# Patient Record
Sex: Female | Born: 1965 | Hispanic: Yes | Marital: Married
Health system: Southern US, Community
[De-identification: ages and names within clinical notes are randomized; demographics above are authoritative.]

## PROBLEM LIST (undated history)

## (undated) HISTORY — PX: BREAST BIOPSY: SHX20

---

## 2014-12-13 ENCOUNTER — Ambulatory Visit (INDEPENDENT_AMBULATORY_CARE_PROVIDER_SITE_OTHER): Payer: Self-pay | Admitting: Family Medicine

## 2014-12-13 VITALS — BP 112/70 | HR 68 | Temp 97.5°F | Resp 18 | Ht 60.0 in | Wt 142.5 lb

## 2014-12-13 DIAGNOSIS — N63 Unspecified lump in unspecified breast: Secondary | ICD-10-CM

## 2014-12-13 DIAGNOSIS — Z1239 Encounter for other screening for malignant neoplasm of breast: Secondary | ICD-10-CM

## 2014-12-13 NOTE — Patient Instructions (Signed)
I think that the bump in your breast is due to your recent injury and should go away.  However if it is not gone within one month please come back to see me.   Assuming the bump does go away, you should have a screening mammogram anyway.    Please use the scholarship fund application to see if you can get a free or low cost screening exam If your bump does not away I will set you up for a special mammogram to look a the area of concern

## 2014-12-13 NOTE — Progress Notes (Signed)
[facility] [address] [place] [address] [phone]  Date:  12/13/2014   Name:  Tanya Martin   DOB:  February 10, 1966   MRN:  [id number]  PCP:  No PCP Per Patient    Chief Complaint: Chest Pain and Breast Mass   History of Present Illness:  Tanya Martin is a 49 y.o. very pleasant female patient who presents with the following:  Here today as a new patient with complaint of pain in her left breast.  She hit herself with a box this past Friday, and the pain started on Monday.  Today is Sunday. She feels a little bump in the area- she treated it with vinegar and it got better.   The pain has decreased some.   She is generally in good health.  No fever, cough or other chest pain This bump in her left breast was not present prior to her recent injury  There are no active problems to display for this patient.   No past medical history on file.  No past surgical history on file.  History  Substance Use Topics  . Smoking status: Never Smoker   . Smokeless tobacco: Not on file  . Alcohol Use: No    Family History  Problem Relation Age of Onset  . Diabetes Mother     No Known Allergies  Medication list has been reviewed and updated.  No current outpatient prescriptions on file prior to visit.   No current facility-administered medications on file prior to visit.    Review of Systems:  As per HPI- otherwise negative.   Physical Examination: Filed Vitals:   12/13/14 1500  BP: 112/70  Pulse: 68  Temp: 97.5 F (36.4 C)  Resp: 18   Filed Vitals:   12/13/14 1500  Height: 5' (1.524 m)  Weight: 142 lb 8 oz (64.638 kg)   Body mass index is 27.83 kg/(m^2). Ideal Body Weight: Weight in (lb) to have BMI = 25: 127.7  GEN: WDWN, NAD, Non-toxic, A & O x 3 HEENT: Atraumatic, Normocephalic. Neck supple. No masses, No LAD. Ears and Nose: No external deformity. CV: RRR, No M/G/R. No JVD. No thrill. No extra heart sounds. PULM: CTA B, no wheezes,  crackles, rhonchi. No retractions. No resp. distress. No accessory muscle use. ABD: S, NT, ND. No rebound. No HSM. EXTR: No c/c/e NEURO Normal gait.  PSYCH: Normally interactive. Conversant. Not depressed or anxious appearing.  Calm demeanor.  Breasts: the right breast is normal.  The left breast shows a tender nodule at approx 2:00.  It is approx 2cm vertically and 3cm horizontally.  No break in the skin, no redness or fluctuance   Assessment and Plan: Breast nodule  Screening for breast cancer  Suspect nodule in the left breast is due to recent trauma.  Counseled pt that if this resolves she will need a screening mammogram- printed and helped them begin a mammo scholarship application.  However if nodule does not go away will need a diagnostic mammo instead- went over this with pt and her daughter who accompanied her to visit today  Signed Abbe Amsterdam, MD

## 2017-05-17 ENCOUNTER — Ambulatory Visit: Payer: Self-pay | Attending: Oncology

## 2017-05-17 ENCOUNTER — Other Ambulatory Visit: Payer: Self-pay

## 2017-05-17 ENCOUNTER — Ambulatory Visit
Admission: RE | Admit: 2017-05-17 | Discharge: 2017-05-17 | Disposition: A | Discharge status: Home / Self Care | Payer: Self-pay | Source: Ambulatory Visit | Attending: Oncology | Admitting: Oncology

## 2017-05-17 VITALS — BP 108/73 | HR 61 | Temp 95.8°F | Resp 18 | Ht 60.0 in | Wt 149.0 lb

## 2017-05-17 DIAGNOSIS — Z0000 Encounter for general adult medical examination without abnormal findings: Secondary | ICD-10-CM

## 2017-05-17 NOTE — Progress Notes (Signed)
Subjective:     Patient ID: Tanya Martin, female   DOB: 26-Jun-1965, 51 y.o.   MRN: [id number]  HPI   Review of Systems     Objective:   Physical Exam  Pulmonary/Chest: Right breast exhibits no inverted nipple, no mass, no nipple discharge, no skin change and no tenderness. Left breast exhibits no inverted nipple, no mass, no nipple discharge, no skin change and no tenderness. Breasts are symmetrical.       Assessment:     51 year old hispanicpatient presents for [facility] clinic visit.  Tanya Martin interpreted exam.  Patient states she had a negative pap two weeks ago. Tanya Martin is requesting results to be scanned.  Patient screened, and meets [facility] eligibility.  Patient does not have insurance, Medicare or Medicaid.  Handout given on Affordable Care Act.  Instructed patient on breast self-exam using teach back method.  CBE unremarkable.  No mass or lump palpated.    Plan:     Sent for bilateral screening mammogram.

## 2017-05-19 ENCOUNTER — Other Ambulatory Visit: Payer: Self-pay | Admitting: *Deleted

## 2017-05-19 DIAGNOSIS — N6489 Other specified disorders of breast: Secondary | ICD-10-CM

## 2018-04-25 ENCOUNTER — Ambulatory Visit
Admission: RE | Admit: 2018-04-25 | Discharge: 2018-04-25 | Disposition: A | Discharge status: Home / Self Care | Payer: Self-pay | Source: Ambulatory Visit | Attending: Oncology | Admitting: Oncology

## 2018-04-25 ENCOUNTER — Encounter (INDEPENDENT_AMBULATORY_CARE_PROVIDER_SITE_OTHER): Payer: Self-pay

## 2018-04-25 ENCOUNTER — Other Ambulatory Visit: Payer: Self-pay

## 2018-04-25 ENCOUNTER — Ambulatory Visit: Payer: Self-pay | Attending: Oncology | Admitting: *Deleted

## 2018-04-25 ENCOUNTER — Encounter: Payer: Self-pay | Admitting: *Deleted

## 2018-04-25 VITALS — BP 117/73 | HR 52 | Temp 97.7°F | Ht 60.0 in | Wt 142.0 lb

## 2018-04-25 DIAGNOSIS — N6459 Other signs and symptoms in breast: Secondary | ICD-10-CM | POA: Insufficient documentation

## 2018-04-25 NOTE — Progress Notes (Signed)
  Subjective:     Patient ID: Tanya Martin, female   DOB: 12/14/1965, 52 y.o.   MRN: [id number]  HPI   Review of Systems     Objective:   Physical Exam  Pulmonary/Chest:         Assessment:     52 year old Hispanic female presents to [facility] for annual screening.  Tanya Martin, the interpreter present during the interview and exam.  Patient did not return last year for her additional views and ultrasound of the right breast for distortion and asymmetry.  On clinical breast exam today there is an asymmetrical 3 cm thickening at 1-2:00 left breast.  Patient states 2 years ago a box hit her in the breast at that spot, and she had some swelling there. Taught self breast awareness.   Her 48 year old sister has just been diagnosed with breast cancer.  Patient states her sister had genetic testing and no mutations were found.  Last pap on 04/19/17 was negative without HPV co-testing.  Patient has been screened for eligibility.  She does not have any insurance, Medicare or Medicaid.  She also meets financial eligibility.  Hand-out given on the Affordable Care Act. Risk Assessment    Risk Scores      04/25/2018   Last edited by: Tanya Presto, RN   5-year risk: 1.3 %   Lifetime risk: 10 %            Plan:     *Bilateral diagnostic mammogram with ultrasound ordered.  Will follow-up per [facility] protocol.  Encouraged annual screening.**

## 2018-04-25 NOTE — Patient Instructions (Signed)
Gave patient hand-out, Women Staying Healthy, Active and Well from [facility], with education on breast health, pap smears, heart and colon health. 

## 2018-04-26 ENCOUNTER — Encounter: Payer: Self-pay | Admitting: *Deleted

## 2018-04-26 NOTE — Progress Notes (Signed)
Patient was unable to get her mammogram yesterday.  Letter sent for translation with appointment for her rescheduled mammogram.

## 2018-04-30 ENCOUNTER — Encounter: Payer: Self-pay | Admitting: *Deleted

## 2018-04-30 NOTE — Progress Notes (Signed)
Letter mailed to inform patient of her mammogram appointment.

## 2018-05-11 ENCOUNTER — Ambulatory Visit
Admission: RE | Admit: 2018-05-11 | Discharge: 2018-05-11 | Disposition: A | Discharge status: Home / Self Care | Payer: Self-pay | Source: Ambulatory Visit | Attending: Oncology | Admitting: Oncology

## 2018-05-11 ENCOUNTER — Ambulatory Visit: Admission: RE | Admit: 2018-05-11 | Payer: Self-pay | Source: Ambulatory Visit

## 2018-05-11 DIAGNOSIS — N6459 Other signs and symptoms in breast: Secondary | ICD-10-CM | POA: Insufficient documentation

## 2018-05-22 ENCOUNTER — Encounter: Payer: Self-pay | Admitting: *Deleted

## 2018-05-22 NOTE — Progress Notes (Signed)
Letter mailed from [facility] to inform patient of her normal mammogram results.  Patient is to follow-up with annual screening in one year.  HSIS to Christy. 

## 2018-05-25 ENCOUNTER — Encounter: Payer: Self-pay | Admitting: Family Medicine

## 2019-11-08 ENCOUNTER — Ambulatory Visit: Payer: HRSA Program | Attending: Internal Medicine

## 2019-11-08 DIAGNOSIS — Z20822 Contact with and (suspected) exposure to covid-19: Secondary | ICD-10-CM | POA: Insufficient documentation

## 2019-11-09 LAB — NOVEL CORONAVIRUS, NAA: SARS-CoV-2, NAA: NOT DETECTED

## 2019-11-09 LAB — SARS-COV-2, NAA 2 DAY TAT

## 2020-07-03 ENCOUNTER — Emergency Department: Payer: Worker's Compensation

## 2020-07-03 ENCOUNTER — Other Ambulatory Visit: Payer: Self-pay

## 2020-07-03 DIAGNOSIS — Y990 Civilian activity done for income or pay: Secondary | ICD-10-CM | POA: Diagnosis not present

## 2020-07-03 DIAGNOSIS — W010XXA Fall on same level from slipping, tripping and stumbling without subsequent striking against object, initial encounter: Secondary | ICD-10-CM | POA: Diagnosis not present

## 2020-07-03 DIAGNOSIS — S3992XA Unspecified injury of lower back, initial encounter: Secondary | ICD-10-CM | POA: Diagnosis present

## 2020-07-03 DIAGNOSIS — S300XXA Contusion of lower back and pelvis, initial encounter: Secondary | ICD-10-CM | POA: Diagnosis not present

## 2020-07-03 NOTE — ED Triage Notes (Signed)
Assist of spanish interpreter enrique # [id number], with stratus. Pt states she slipped and fell at work on Tuesday landing on buttocks. Pt states she has not bee seen and continues to have buttock/low bak pain. Pt states she works for [employer].

## 2020-07-03 NOTE — ED Notes (Signed)
Pt provided with supervisors name Mikle Bosworth for workers compensation question no answer left VM [phone]

## 2020-07-04 ENCOUNTER — Emergency Department
Admission: EM | Admit: 2020-07-04 | Discharge: 2020-07-04 | Disposition: A | Discharge status: Home / Self Care | Payer: Worker's Compensation | Attending: Emergency Medicine | Admitting: Emergency Medicine

## 2020-07-04 DIAGNOSIS — S300XXA Contusion of lower back and pelvis, initial encounter: Secondary | ICD-10-CM

## 2020-07-04 MED ORDER — LIDOCAINE 5 % EX PTCH
1.0000 | MEDICATED_PATCH | CUTANEOUS | Status: DC
  Administered 2020-07-04: 1 via TRANSDERMAL
  Filled 2020-07-04: qty 1

## 2020-07-04 MED ORDER — LIDOCAINE 5 % EX PTCH: 1.0000 | MEDICATED_PATCH | Freq: Two times a day (BID) | CUTANEOUS | 0 refills | Status: AC

## 2020-07-04 MED ORDER — TRAMADOL HCL 50 MG PO TABS: 50.0000 mg | ORAL_TABLET | Freq: Four times a day (QID) | ORAL | 0 refills | Status: AC | PRN

## 2020-07-04 MED ORDER — NAPROXEN 500 MG PO TABS: 500.0000 mg | ORAL_TABLET | Freq: Two times a day (BID) | ORAL | Status: AC

## 2020-07-04 MED ORDER — TRAMADOL HCL 50 MG PO TABS: 50.0000 mg | ORAL_TABLET | Freq: Four times a day (QID) | ORAL | 0 refills | Status: DC | PRN

## 2020-07-04 MED ORDER — LIDOCAINE 5 % EX PTCH: 1.0000 | MEDICATED_PATCH | Freq: Two times a day (BID) | CUTANEOUS | 0 refills | Status: DC

## 2020-07-04 NOTE — ED Provider Notes (Signed)
[facility] Emergency Department Provider Note   ____________________________________________   Event Date/Time   First MD Initiated Contact with Patient 07/04/20 [phone]     (approximate)  I have reviewed the triage vital signs and the nursing notes.   HISTORY  Chief Complaint Fall    HPI Tanya Martin is a 55 y.o. female patient presents with coccyx pain secondary to a fall.  Patient states incident occurred at work when she slipped on some wall on the floor at work.  Incident occurred 4 days ago.  Patient denies radicular component to her fall.  Patient denies bladder or bowel dysfunction.  Patient states only mild relief over-the-counter medications.  Rates the pain as a 10/10.  Described pain as "achy".         No past medical history on file.  There are no problems to display for this patient.   Past Surgical History:  Procedure Laterality Date  . BREAST BIOPSY Right    benign    Prior to Admission medications   Medication Sig Start Date End Date Taking? Authorizing Provider  naproxen (NAPROSYN) 500 MG tablet Take 1 tablet (500 mg total) by mouth 2 (two) times daily with a meal. 07/04/20  Yes Joni Reining, PA-C  lidocaine (LIDODERM) 5 % Place 1 patch onto the skin every 12 (twelve) hours. Remove & Discard patch within 12 hours or as directed by MD 07/04/20 07/04/21  Joni Reining, PA-C  traMADol (ULTRAM) 50 MG tablet Take 1 tablet (50 mg total) by mouth every 6 (six) hours as needed for moderate pain. 07/04/20   Joni Reining, PA-C    Allergies Patient has no known allergies.  Family History  Problem Relation Age of Onset  . Diabetes Mother   . Breast cancer Sister     Social History Social History   Tobacco Use  . Smoking status: Never Smoker  Substance Use Topics  . Alcohol use: No    Alcohol/week: 0.0 standard drinks  . Drug use: No    Review of Systems Constitutional: No fever/chills Eyes: No visual changes. ENT: No  sore throat. Cardiovascular: Denies chest pain. Respiratory: Denies shortness of breath. Gastrointestinal: No abdominal pain.  No nausea, no vomiting.  No diarrhea.  No constipation. Genitourinary: Negative for dysuria. Musculoskeletal: Coccyx and left buttocks pain. Skin: Negative for rash. Neurological: Negative for headaches, focal weakness or numbness.   ____________________________________________   PHYSICAL EXAM:  VITAL SIGNS: ED Triage Vitals [07/03/20 2156]  Enc Vitals Group     BP (!) 142/69     Pulse Rate (!) 57     Resp 16     Temp 98.7 F (37.1 C)     Temp Source Oral     SpO2 100 %     Weight 147 lb (66.7 kg)     Height 5' (1.524 m)     Head Circumference      Peak Flow      Pain Score 10     Pain Loc      Pain Edu?      Excl. in GC?    Constitutional: Alert and oriented. Well appearing and in no acute distress.  Patient able to sit up and stand from a supine position without difficulty. Neck: No cervical spine tenderness to palpation. Cardiovascular: Bradycardic, regular rhythm. Grossly normal heart sounds.  Good peripheral circulation. Respiratory: Normal respiratory effort.  No retractions. Lungs CTAB. Gastrointestinal: Soft and nontender. No distention. No abdominal bruits. No  CVA tenderness. Genitourinary: Deferred Musculoskeletal: No obvious lumbar spinal deformity.  Moderate guarding palpation no pedal 4 through S1.  Patient has full and equal range of motion of the lumbar spine.  Patient has full and equal range of motion of the lower extremities.  Neurologic:  Normal speech and language. No gross focal neurologic deficits are appreciated. No gait instability. Skin:  Skin is warm, dry and intact. No rash noted.  No abrasion, ecchymosis, or edema. Psychiatric: Mood and affect are normal. Speech and behavior are normal.  ____________________________________________   LABS (all labs ordered are listed, but only abnormal results are displayed)  Labs  Reviewed - No data to display ____________________________________________  EKG   ____________________________________________  RADIOLOGY I, Joni Reining, personally viewed and evaluated these images (plain radiographs) as part of my medical decision making, as well as reviewing the written report by the radiologist.  ED MD interpretation: No acute findings on x-ray of the coccyx.  Official radiology report(s): DG Sacrum/Coccyx  Result Date: 07/03/2020 CLINICAL DATA:  Pain status post fall EXAM: SACRUM AND COCCYX - 2+ VIEW COMPARISON:  None. FINDINGS: There is no evidence of fracture or other focal bone lesions. IMPRESSION: Negative. Electronically Signed   By: Katherine Mantle M.D.   On: 07/03/2020 22:40    ____________________________________________   PROCEDURES  Procedure(s) performed (including Critical Care):  Procedures   ____________________________________________   INITIAL IMPRESSION / ASSESSMENT AND PLAN / ED COURSE  As part of my medical decision making, I reviewed the following data within the electronic MEDICAL RECORD NUMBER         Coccyx pain secondary to contusion.  Discussed no acute findings on x-ray.  Patient given discharge care instructions.  Advised take medication as directed.  Do not take tramadol while working.  Return back to work on 07/06/2020.  Follow-up with Worker's Comp. follow-up PCP.      ____________________________________________   FINAL CLINICAL IMPRESSION(S) / ED DIAGNOSES  Final diagnoses:  Coccyx contusion, initial encounter     ED Discharge Orders         Ordered    traMADol (ULTRAM) 50 MG tablet  Every 6 hours PRN,   Status:  Discontinued        07/04/20 0756    lidocaine (LIDODERM) 5 %  Every 12 hours,   Status:  Discontinued        07/04/20 0756    lidocaine (LIDODERM) 5 %  Every 12 hours        07/04/20 0757    traMADol (ULTRAM) 50 MG tablet  Every 6 hours PRN        07/04/20 0757    naproxen (NAPROSYN) 500 MG  tablet  2 times daily with meals        07/04/20 0801          *Please note:  Tanya Martin was evaluated in Emergency Department on 07/04/2020 for the symptoms described in the history of present illness. She was evaluated in the context of the global COVID-19 pandemic, which necessitated consideration that the patient might be at risk for infection with the SARS-CoV-2 virus that causes COVID-19. Institutional protocols and algorithms that pertain to the evaluation of patients at risk for COVID-19 are in a state of rapid change based on information released by regulatory bodies including the CDC and federal and state organizations. These policies and algorithms were followed during the patient's care in the ED.  Some ED evaluations and interventions may be delayed as a result of  limited staffing during and the pandemic.*   Note:  This document was prepared using Dragon voice recognition software and may include unintentional dictation errors.    Joni Reining, PA-C 07/04/20 2992    Jene Every, MD 07/07/20 [phone]

## 2020-07-04 NOTE — Discharge Instructions (Signed)
Follow discharge care instruction take medication as directed. °

## 2020-11-03 ENCOUNTER — Encounter: Payer: Self-pay | Admitting: Family Medicine

## 2020-11-18 ENCOUNTER — Other Ambulatory Visit: Payer: Self-pay

## 2020-11-18 ENCOUNTER — Ambulatory Visit
Admission: RE | Admit: 2020-11-18 | Discharge: 2020-11-18 | Disposition: A | Discharge status: Home / Self Care | Payer: Self-pay | Source: Ambulatory Visit | Attending: Oncology | Admitting: Oncology

## 2020-11-18 ENCOUNTER — Encounter: Payer: Self-pay | Admitting: *Deleted

## 2020-11-18 ENCOUNTER — Ambulatory Visit: Payer: Self-pay | Attending: Oncology | Admitting: *Deleted

## 2020-11-18 VITALS — BP 117/65 | HR 59 | Temp 97.8°F | Ht 60.0 in | Wt 149.0 lb

## 2020-11-18 DIAGNOSIS — Z0000 Encounter for general adult medical examination without abnormal findings: Secondary | ICD-10-CM | POA: Insufficient documentation

## 2020-11-18 NOTE — Patient Instructions (Signed)
Gave patient hand-out, Women Staying Healthy, Active and Well from [facility], with education on breast health, pap smears, heart and colon health. 

## 2020-11-18 NOTE — Progress Notes (Signed)
  Subjective:     Patient ID: Tanya Martin, female   DOB: August 31, 1965, 55 y.o.   MRN: [id number]  HPI   [facility] Medical History Record - 11/18/20 2951      Breast History   Screening cycle New    Provider (CBE) Tanya Martin drew    Initial Mammogram 11/18/20    Last Mammogram Annual    Last Mammogram Date 05/11/18    Provider (Mammogram)  [facility]    Recent Breast Symptoms None      Breast Cancer History   Breast Cancer History Patient and mother/daughter/sister have had breast cancer    Comments/Details sister breast 19 yo      Previous History of Breast Problems   Breast Surgery or Biopsy None    Breast Implants N/A    BSE Done Monthly      Gynecological/Obstetrical History   LMP --   55 yo   Is there any chance that the client could be pregnant?  No    Age at menarche 55    Age at menopause 27    PAP smear history Annually    Date of last PAP  07/10/20    Provider (PAP) Tanya Martin neg/neg    Age at first live birth 55    Breast fed children Yes (type length in comments)    DES Exposure No    Cervical, Uterine or Ovarian cancer No    Family history of Cervial, Uterine or Ovarian cancer Yes   cousin uterine 60   Hysterectomy No    Cervix removed No    Ovaries removed No    Laser/Cryosurgery No    Current method of birth control None    Current method of Estrogen/Hormone replacement None    Smoking history None    Comments no insurance             Review of Systems     Objective:   Physical Exam Chest:  Breasts:     Right: No swelling, bleeding, inverted nipple, mass, nipple discharge, skin change, tenderness, axillary adenopathy or supraclavicular adenopathy.     Left: No swelling, bleeding, inverted nipple, mass, nipple discharge, skin change, tenderness, axillary adenopathy or supraclavicular adenopathy.    Lymphadenopathy:     Upper Body:     Right upper body: No supraclavicular or axillary adenopathy.     Left upper body: No supraclavicular or axillary  adenopathy.        Assessment:     55 year old Hispanic female returns to [facility] for annual screening.  [employer] interpreter Tanya Martin, #[id number] used for interpretation during the interview and exam.  Clinical breast exam unremarkable. Taught self breast awareness.  Last pap on 07/10/20 was negative / negative.  Next pap will be due in 2027.  Patient has been screened for eligibility.  She does not have any insurance, Medicare or Medicaid.  She also meets financial eligibility.   Risk Assessment    Risk Scores      11/18/2020 04/25/2018   Last edited by: Tanya Presto, RN Tanya Presto, RN   5-year risk: 1.4 % 1.3 %   Lifetime risk: 9.6 % 10 %            Plan:     Screening mammogram ordered.  Will follow up per [facility] protocol.

## 2020-11-19 ENCOUNTER — Encounter: Payer: Self-pay | Admitting: *Deleted

## 2020-11-19 NOTE — Progress Notes (Signed)
Letter mailed from [facility] to inform patient of her normal mammogram results.  Patient is to follow-up with annual screening in one year. 

## 2022-12-14 IMAGING — MG MM DIGITAL SCREENING BILAT W/ TOMO AND CAD
8 series · 8 of 24 positions shown · non-contrast
Comparison: Previous exam(s).

CLINICAL DATA: Screening.

EXAM:
DIGITAL SCREENING BILATERAL MAMMOGRAM WITH TOMOSYNTHESIS AND CAD
TECHNIQUE: Bilateral screening digital craniocaudal and mediolateral oblique
mammograms were obtained. Bilateral screening digital breast
tomosynthesis was performed. The images were evaluated with
computer-aided detection.

[L MLO synth-2D]
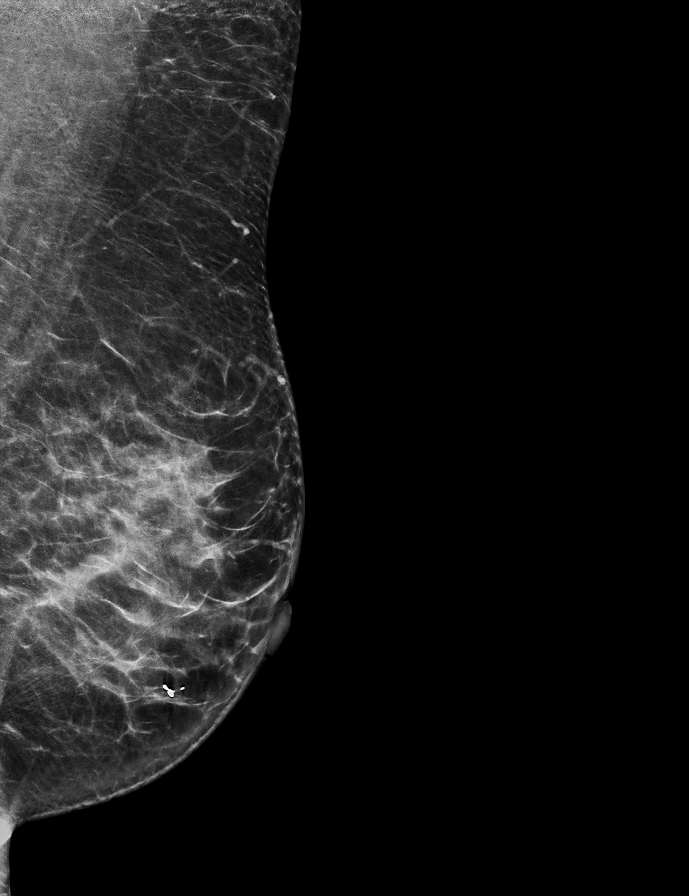

[R MLO synth-2D]
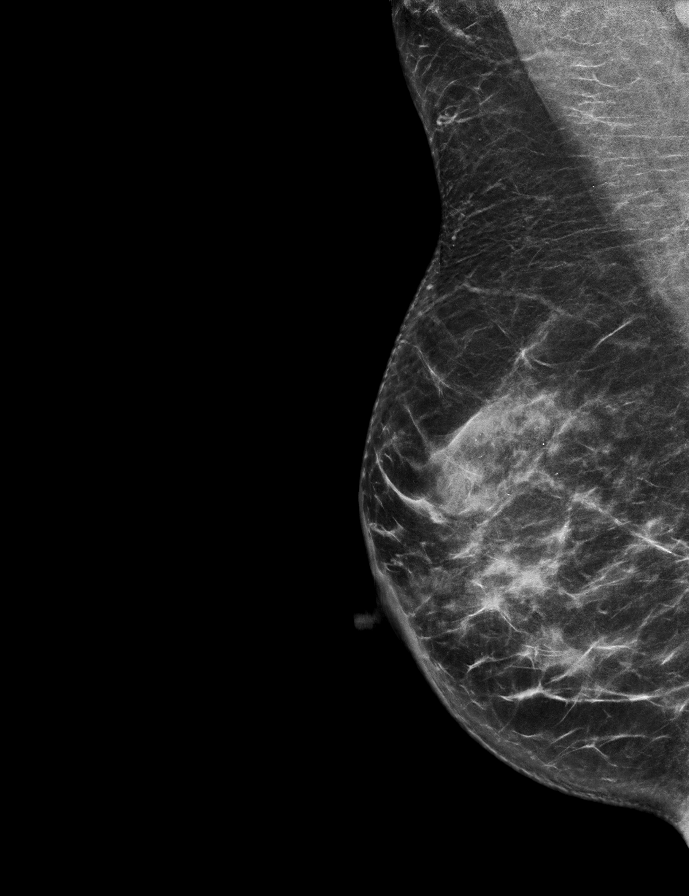

[L CC synth-2D]
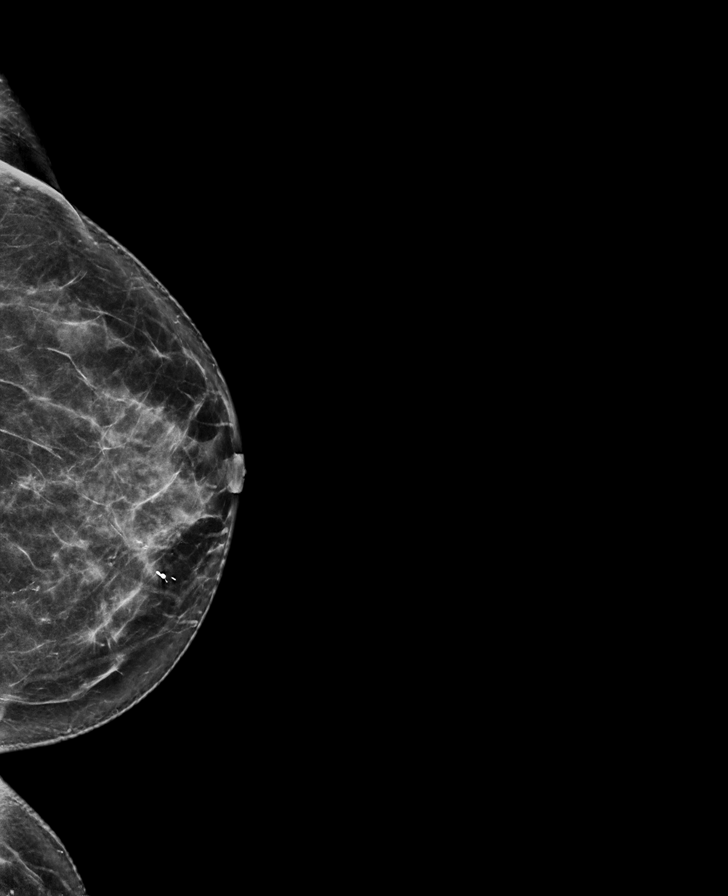

[R CC synth-2D]
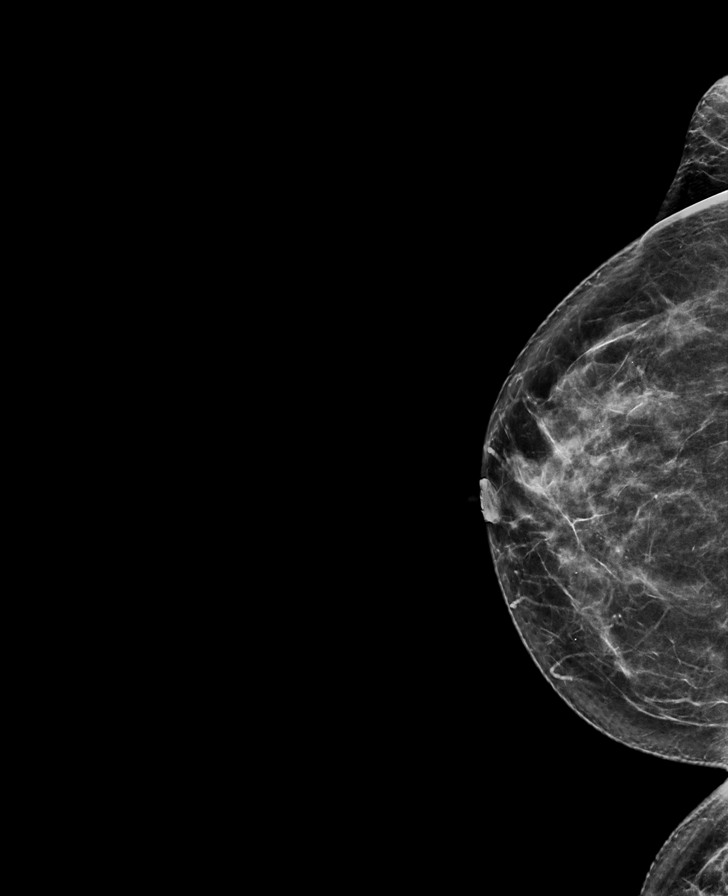

[R CC tomo · tomo slice 37/73.0]
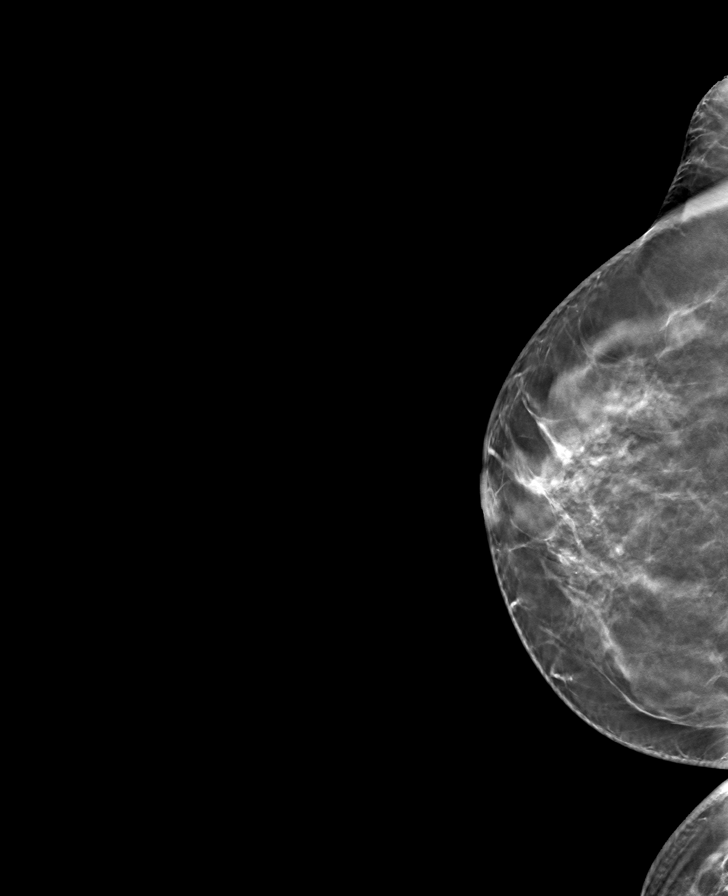

[R MLO tomo · tomo slice 37/73.0]
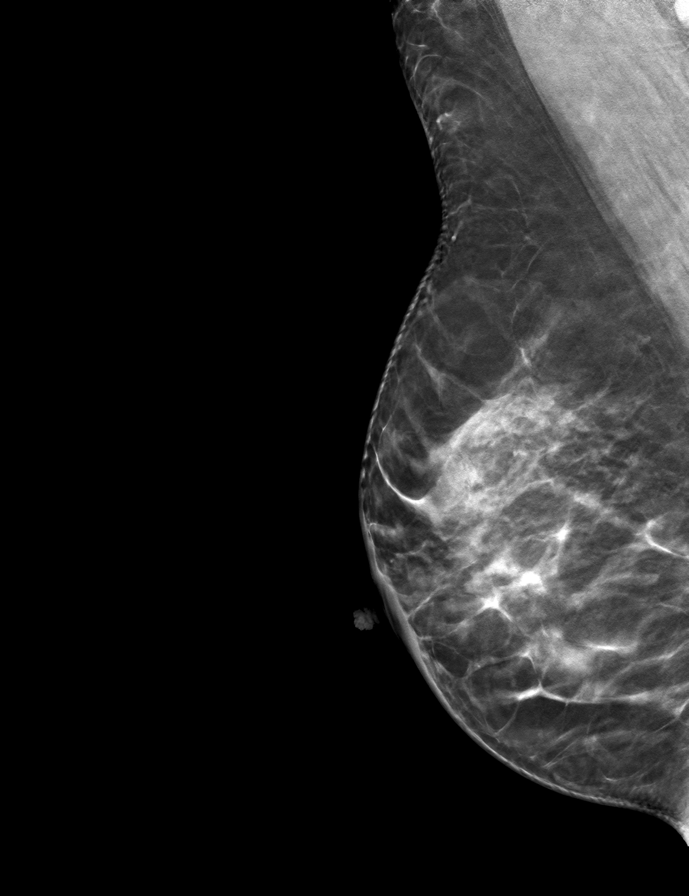

[L CC tomo · tomo slice 37/72.0]
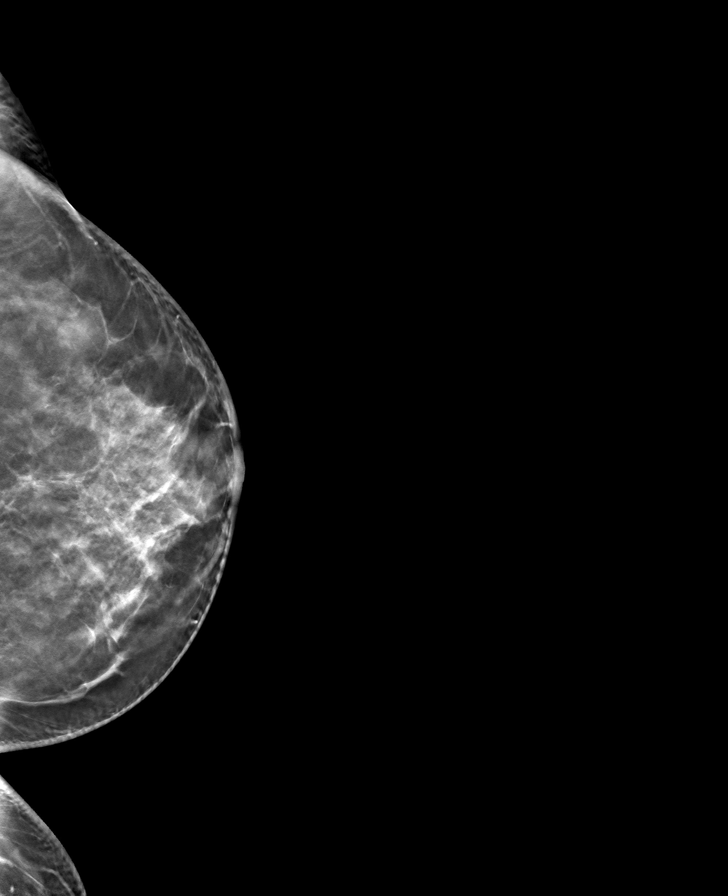

[L MLO tomo · tomo slice 37/73.0]
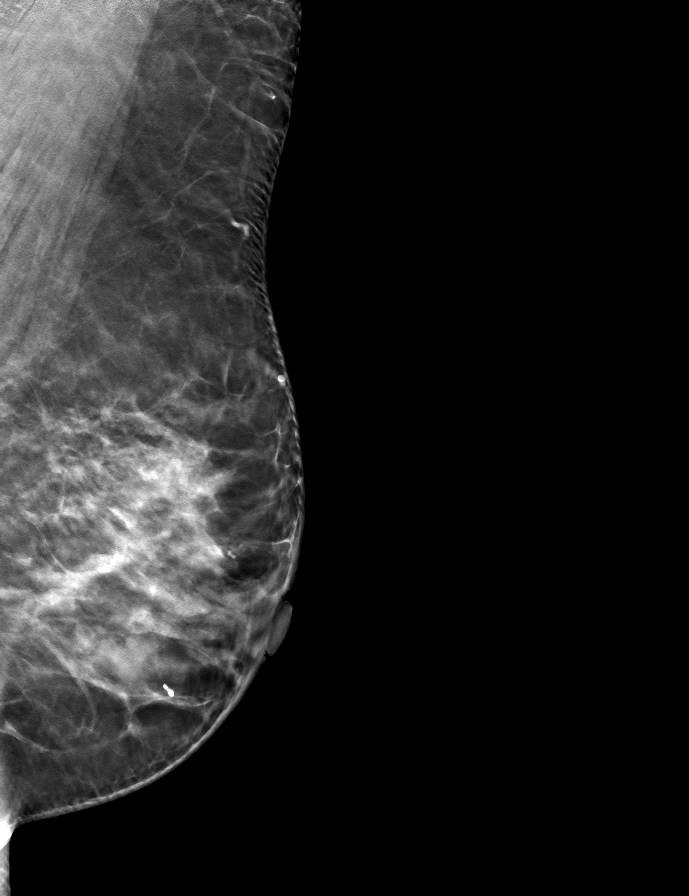

[8 of 24 positions shown; findings below may reference images not displayed]

ACR Breast Density Category c: The breast tissue is heterogeneously
dense, which may obscure small masses.
FINDINGS: There are no findings suspicious for malignancy. The images were
evaluated with computer-aided detection.
IMPRESSION: No mammographic evidence of malignancy. A result letter of this
screening mammogram will be mailed directly to the patient.

RECOMMENDATION:
Screening mammogram in one year. (Code:T4-5-GWO)

BI-RADS CATEGORY  1: Negative.
